# Patient Record
Sex: Female | Born: 1970 | Race: Black or African American | Hispanic: No | Marital: Single | Smoking: Current some day smoker
Health system: Southern US, Community
[De-identification: ages and names within clinical notes are randomized; demographics above are authoritative.]

## PROBLEM LIST (undated history)

## (undated) HISTORY — PX: TUBAL LIGATION: SHX77

## (undated) HISTORY — PX: HIP FRACTURE SURGERY: SHX118

---

## 2006-06-10 ENCOUNTER — Emergency Department (HOSPITAL_COMMUNITY): Admission: EM | Admit: 2006-06-10 | Discharge: 2006-06-11 | Payer: Self-pay | Admitting: Emergency Medicine

## 2007-06-26 ENCOUNTER — Encounter: Admission: RE | Admit: 2007-06-26 | Discharge: 2007-06-26 | Payer: Self-pay | Admitting: Internal Medicine

## 2007-07-07 ENCOUNTER — Encounter: Admission: RE | Admit: 2007-07-07 | Discharge: 2007-07-07 | Payer: Self-pay | Admitting: Internal Medicine

## 2008-02-12 ENCOUNTER — Encounter: Admission: RE | Admit: 2008-02-12 | Discharge: 2008-02-12 | Payer: Self-pay | Admitting: Internal Medicine

## 2008-10-31 ENCOUNTER — Encounter: Admission: RE | Admit: 2008-10-31 | Discharge: 2008-10-31 | Payer: Self-pay | Admitting: Internal Medicine

## 2009-06-17 ENCOUNTER — Inpatient Hospital Stay (HOSPITAL_COMMUNITY): Admission: EM | Admit: 2009-06-17 | Discharge: 2009-06-20 | Payer: Self-pay | Admitting: Emergency Medicine

## 2009-06-17 ENCOUNTER — Emergency Department (HOSPITAL_COMMUNITY): Admission: EM | Admit: 2009-06-17 | Discharge: 2009-06-17 | Payer: Self-pay | Admitting: Emergency Medicine

## 2010-01-29 ENCOUNTER — Encounter: Admission: RE | Admit: 2010-01-29 | Discharge: 2010-01-29 | Payer: Self-pay | Admitting: Internal Medicine

## 2010-03-23 ENCOUNTER — Encounter: Payer: Self-pay | Admitting: Internal Medicine

## 2010-05-19 LAB — CBC
HCT: 33.6 % — ABNORMAL LOW (ref 36.0–46.0)
Hemoglobin: 11.3 g/dL — ABNORMAL LOW (ref 12.0–15.0)
MCHC: 33.6 g/dL (ref 30.0–36.0)
MCHC: 34.2 g/dL (ref 30.0–36.0)
MCV: 85.5 fL (ref 78.0–100.0)
MCV: 85.8 fL (ref 78.0–100.0)
MCV: 86.1 fL (ref 78.0–100.0)
MCV: 86.3 fL (ref 78.0–100.0)
Platelets: 186 10*3/uL (ref 150–400)
Platelets: 191 10*3/uL (ref 150–400)
Platelets: 208 10*3/uL (ref 150–400)
RBC: 3.87 MIL/uL (ref 3.87–5.11)
RBC: 3.89 MIL/uL (ref 3.87–5.11)
RBC: 3.94 MIL/uL (ref 3.87–5.11)
RDW: 14.9 % (ref 11.5–15.5)
RDW: 15 % (ref 11.5–15.5)
WBC: 14.1 10*3/uL — ABNORMAL HIGH (ref 4.0–10.5)
WBC: 23.1 10*3/uL — ABNORMAL HIGH (ref 4.0–10.5)

## 2010-05-19 LAB — BASIC METABOLIC PANEL
BUN: 10 mg/dL (ref 6–23)
BUN: 12 mg/dL (ref 6–23)
CO2: 25 mEq/L (ref 19–32)
CO2: 30 mEq/L (ref 19–32)
Calcium: 8.6 mg/dL (ref 8.4–10.5)
Chloride: 100 mEq/L (ref 96–112)
Chloride: 107 mEq/L (ref 96–112)
Creatinine, Ser: 0.61 mg/dL (ref 0.4–1.2)
Creatinine, Ser: 0.62 mg/dL (ref 0.4–1.2)
GFR calc Af Amer: >60 mL/min (ref >60)
GFR calc Af Amer: >60 mL/min (ref >60)
GFR calc non Af Amer: >60 mL/min (ref >60)
Glucose, Bld: 129 mg/dL — ABNORMAL HIGH (ref 70–99)
Glucose, Bld: 151 mg/dL — ABNORMAL HIGH (ref 70–99)
Potassium: 3.4 mEq/L — ABNORMAL LOW (ref 3.5–5.1)
Potassium: 3.9 mEq/L (ref 3.5–5.1)
Sodium: 135 mEq/L (ref 135–145)

## 2010-05-19 LAB — POCT I-STAT 3, ART BLOOD GAS (G3+)
Acid-Base Excess: 1 mmol/L (ref 0.0–2.0)
Bicarbonate: 25.8 meq/L — ABNORMAL HIGH (ref 20.0–24.0)
O2 Saturation: 97 %
Patient temperature: 98.6
TCO2: 24 mmol/L (ref 0–100)
TCO2: 27 mmol/L (ref 0–100)
pCO2 arterial: 34.3 mmHg — ABNORMAL LOW (ref 35.0–45.0)
pCO2 arterial: 41.8 mmHg (ref 35.0–45.0)
pH, Arterial: 7.398 (ref 7.350–7.400)
pH, Arterial: 7.441 — ABNORMAL HIGH (ref 7.350–7.400)
pO2, Arterial: 91 mmHg (ref 80.0–100.0)

## 2010-05-19 LAB — DIFFERENTIAL
Basophils Absolute: 0 10*3/uL (ref 0.0–0.1)
Basophils Absolute: 0 K/uL (ref 0.0–0.1)
Basophils Relative: 0 % (ref 0–1)
Basophils Relative: 0 % (ref 0–1)
Eosinophils Absolute: 0 K/uL (ref 0.0–0.7)
Eosinophils Absolute: 0.2 10*3/uL (ref 0.0–0.7)
Eosinophils Relative: 0 % (ref 0–5)
Lymphocytes Relative: 3 % — ABNORMAL LOW (ref 12–46)
Lymphs Abs: 0.6 K/uL — ABNORMAL LOW (ref 0.7–4.0)
Monocytes Absolute: 0.4 K/uL (ref 0.1–1.0)
Monocytes Relative: 2 % — ABNORMAL LOW (ref 3–12)
Monocytes Relative: 4 % (ref 3–12)
Neutro Abs: 19.6 K/uL — ABNORMAL HIGH (ref 1.7–7.7)
Neutrophils Relative %: 87 % — ABNORMAL HIGH (ref 43–77)
Neutrophils Relative %: 95 % — ABNORMAL HIGH (ref 43–77)

## 2010-05-19 LAB — POCT I-STAT, CHEM 8
Glucose, Bld: 112 mg/dL — ABNORMAL HIGH (ref 70–99)
HCT: 42 % (ref 36.0–46.0)
Hemoglobin: 14.3 g/dL (ref 12.0–15.0)
Potassium: 3.2 mEq/L — ABNORMAL LOW (ref 3.5–5.1)
Sodium: 143 mEq/L (ref 135–145)

## 2010-05-19 LAB — CARDIAC PANEL(CRET KIN+CKTOT+MB+TROPI)
Total CK: 129 U/L (ref 7–177)
Troponin I: 0.01 ng/mL (ref 0.00–0.06)

## 2010-05-19 LAB — EXPECTORATED SPUTUM ASSESSMENT W GRAM STAIN, RFLX TO RESP C

## 2010-05-19 LAB — CULTURE, BLOOD (ROUTINE X 2)
Culture: NO GROWTH
Culture: NO GROWTH

## 2010-05-19 LAB — POCT CARDIAC MARKERS
CKMB, poc: 1.2 ng/mL (ref 1.0–8.0)
Myoglobin, poc: 52 ng/mL (ref 12–200)

## 2010-05-19 LAB — CK TOTAL AND CKMB (NOT AT ARMC): Total CK: 95 U/L (ref 7–177)

## 2010-05-19 LAB — MRSA PCR SCREENING: MRSA by PCR: NEGATIVE

## 2010-05-19 LAB — CULTURE, RESPIRATORY W GRAM STAIN

## 2011-01-18 IMAGING — CR DG CHEST 1V PORT
1 series · 1 of 1 positions shown · non-contrast
Comparison: 06/17/2009

CLINICAL DATA: COPD exacerbation.

PORTABLE CHEST - 1 VIEW

[view not recorded]
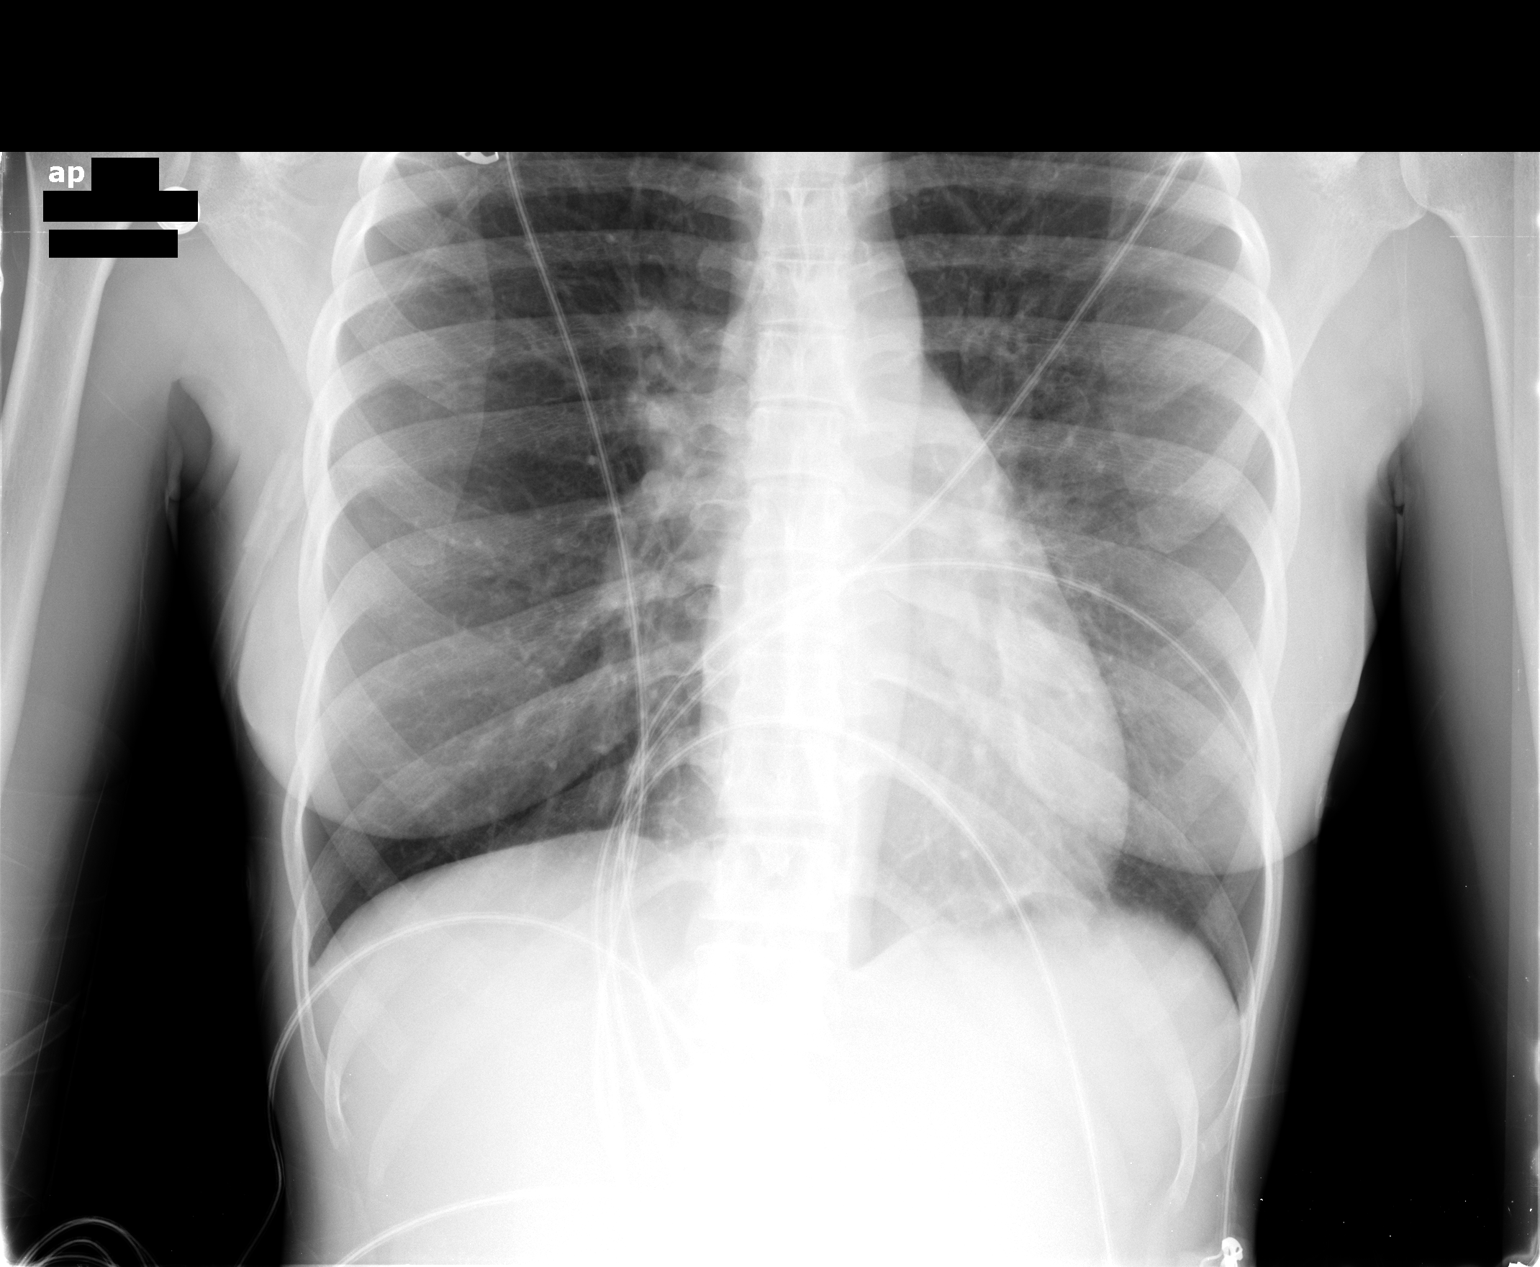

[1 of 1 positions shown; findings below may reference images not displayed]

FINDINGS: Lungs are hyperaerated but clear.  Mild central
peribronchial thickening compatible with bronchitic changes.  No
infiltrate/atelectasis.  Normal cardiomediastinal silhouette.  Bony
thorax intact.
IMPRESSION: COPD.  Chronic bronchitic markings.

## 2016-04-03 ENCOUNTER — Encounter (HOSPITAL_COMMUNITY): Payer: Self-pay | Admitting: Emergency Medicine

## 2016-04-03 ENCOUNTER — Emergency Department (HOSPITAL_COMMUNITY)
Admission: EM | Admit: 2016-04-03 | Discharge: 2016-04-03 | Disposition: A | Discharge status: Home / Self Care | Payer: Self-pay | Attending: Emergency Medicine | Admitting: Emergency Medicine

## 2016-04-03 DIAGNOSIS — F17200 Nicotine dependence, unspecified, uncomplicated: Secondary | ICD-10-CM | POA: Insufficient documentation

## 2016-04-03 DIAGNOSIS — G629 Polyneuropathy, unspecified: Secondary | ICD-10-CM

## 2016-04-03 NOTE — ED Provider Notes (Signed)
[facility]-EMERGENCY DEPT Provider Note   CSN: [id number] Arrival date & time: 04/03/16  1120  By signing my name below, I, Doreatha MartinEva Mathews, attest that this documentation has been prepared under the direction and in the presence of Rolan BuccoMelanie Giovan Pinsky, MD. Electronically Signed: Doreatha MartinEva Mathews, ED Scribe. 04/03/16. 12:10 PM.     History   Chief Complaint Chief Complaint  Patient presents with  . Extremity Weakness    HPI Emily BrasLafaris Khan is a 46 y.o. female who presents to the Emergency Department complaining of the sensation of weakness, numbness and heaviness in her medial left upper arm for 2 days. She reports normal grip strength in the left hand. She denies recent injury, trauma or falls. 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 She also reports she sleeps on the left side. Pt reports she has been experiencing the same sensation daily for 2 weeks, but not as long lasting. No prior h/o similar symptoms before 2 weeks ago. Pt denies taking OTC medications at home to improve symptoms. She denies pain in the arm, visual changes, speech difficulty, difficulty ambulating, neck pain, HA.    The history is provided by the patient. No language interpreter was used.    History reviewed. No pertinent past medical history.  There are no active problems to display for this patient.   Past Surgical History:  Procedure Laterality Date  . HIP FRACTURE SURGERY    . TUBAL LIGATION      OB History    No data available       Home Medications    Prior to Admission medications   Not on File    Family History Family History  Problem Relation Age of Onset  . Heart disease Mother     Social History Social History  Substance Use Topics  . Smoking status: Current Some Day Smoker  . Smokeless tobacco: Never Used  . Alcohol use Yes     Comment: occ     Allergies   Sulfa antibiotics   Review of Systems Review of Systems  Constitutional: Negative for fever.    Eyes: Negative for visual disturbance.  Gastrointestinal: Negative for nausea and vomiting.  Musculoskeletal: Negative for arthralgias, back pain, gait problem and neck pain.  Skin: Negative for wound.  Neurological: Positive for weakness and numbness. Negative for speech difficulty and headaches.     Physical Exam Updated Vital Signs BP 137/89 (BP Location: Right Arm)   Pulse 89   Temp 97.9 F (36.6 C) (Oral)   Resp 18   Wt 122 lb (55.3 kg)   SpO2 100%   Physical Exam  Constitutional: She is oriented to person, place, and time. She appears well-developed and well-nourished.  HENT:  Head: Normocephalic and atraumatic.  Neck: Normal range of motion. Neck supple.  Cardiovascular: Normal rate.   Pulmonary/Chest: Effort normal.  Musculoskeletal: She exhibits tenderness. She exhibits no edema.  No swelling to the left arm. Some TTP of the anterior left shoulder but no pain along the neck or trapezius muscles.   Neurological: She is alert and oriented to person, place, and time.  Subjective numbness to light touch over the left bicep but normal sensation in all other areas of the arm. Normal sensation in the shoulder, biceps ans triceps. Normal strength and motor function of the hand. Radial pulses intact.   Skin: Skin is warm and dry.  Psychiatric: She has a normal mood and affect.  Nursing note and vitals reviewed.  ED Treatments / Results   DIAGNOSTIC STUDIES: Oxygen Saturation is 100% on RA, normal by my interpretation.    COORDINATION OF CARE: 12:01 PM Discussed treatment plan with pt at bedside which includes f/u with PCP and pt agreed to plan.    Labs (all labs ordered are listed, but only abnormal results are displayed) Labs Reviewed - No data to display  EKG  EKG Interpretation None       Radiology No results found.  Procedures Procedures (including critical care time)  Medications Ordered in ED Medications - No data to display   Initial  Impression / Assessment and Plan / ED Course  I have reviewed the triage vital signs and the nursing notes.  Pertinent labs & imaging results that were available during my care of the patient were reviewed by me and considered in my medical decision making (see chart for details).     Patient presents with numbness over the biceps area. There is no other numbness to the arm. No appreciated weakness is noted. No neck pain is noted. I feel that she has a nerve impingement likely in the shoulder. I encouraged her to use anti-inflammatory medications and follow-up with her PCP for possible nerve conduction studies or imaging studies of her shoulder.  Final Clinical Impressions(s) / ED Diagnoses   Final diagnoses:  Neuropathy (HCC)    New Prescriptions New Prescriptions   No medications on file    I personally performed the services described in this documentation, which was scribed in my presence.  The recorded information has been reviewed and considered.    Rolan Bucco, MD 04/03/16 [phone]

## 2016-04-03 NOTE — ED Triage Notes (Signed)
Pt reports weakness in l/arm x 24 hours,. Denies chest pain. 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
